# Patient Record
Sex: Male | Born: 1985 | Hispanic: No | Marital: Single | Smoking: Never smoker
Health system: Southern US, Community
[De-identification: ages and names within clinical notes are randomized; demographics above are authoritative.]

## PROBLEM LIST (undated history)

## (undated) DIAGNOSIS — I10 Essential (primary) hypertension: Secondary | ICD-10-CM

---

## 2010-02-19 ENCOUNTER — Ambulatory Visit: Payer: Self-pay | Admitting: Cardiovascular Disease

## 2010-02-19 ENCOUNTER — Observation Stay: Payer: Self-pay | Admitting: Internal Medicine

## 2016-04-24 ENCOUNTER — Encounter (HOSPITAL_COMMUNITY): Payer: Self-pay | Admitting: *Deleted

## 2016-04-24 ENCOUNTER — Emergency Department (HOSPITAL_COMMUNITY)
Admission: EM | Admit: 2016-04-24 | Discharge: 2016-04-24 | Disposition: A | Discharge status: Home / Self Care | Payer: BC Managed Care – PPO | Attending: Emergency Medicine | Admitting: Emergency Medicine

## 2016-04-24 ENCOUNTER — Emergency Department (HOSPITAL_COMMUNITY): Payer: BC Managed Care – PPO

## 2016-04-24 DIAGNOSIS — R002 Palpitations: Secondary | ICD-10-CM | POA: Insufficient documentation

## 2016-04-24 DIAGNOSIS — I10 Essential (primary) hypertension: Secondary | ICD-10-CM | POA: Insufficient documentation

## 2016-04-24 DIAGNOSIS — Z79899 Other long term (current) drug therapy: Secondary | ICD-10-CM | POA: Diagnosis not present

## 2016-04-24 HISTORY — DX: Essential (primary) hypertension: I10

## 2016-04-24 LAB — CBC
HCT: 48.1 % (ref 39.0–52.0)
Hemoglobin: 17.2 g/dL — ABNORMAL HIGH (ref 13.0–17.0)
MCH: 30.2 pg (ref 26.0–34.0)
MCHC: 35.8 g/dL (ref 30.0–36.0)
MCV: 84.4 fL (ref 78.0–100.0)
PLATELETS: 280 10*3/uL (ref 150–400)
RBC: 5.7 MIL/uL (ref 4.22–5.81)
RDW: 12.5 % (ref 11.5–15.5)
WBC: 8.1 10*3/uL (ref 4.0–10.5)

## 2016-04-24 LAB — BASIC METABOLIC PANEL
Anion gap: 15 (ref 5–15)
BUN: 13 mg/dL (ref 6–20)
CHLORIDE: 102 mmol/L (ref 101–111)
CO2: 20 mmol/L — AB (ref 22–32)
CREATININE: 1.07 mg/dL (ref 0.61–1.24)
Calcium: 9.6 mg/dL (ref 8.9–10.3)
GFR calc Af Amer: >60 mL/min (ref >60)
GFR calc non Af Amer: >60 mL/min (ref >60)
Glucose, Bld: 101 mg/dL — ABNORMAL HIGH (ref 65–99)
Potassium: 4 mmol/L (ref 3.5–5.1)
Sodium: 137 mmol/L (ref 135–145)

## 2016-04-24 LAB — I-STAT TROPONIN, ED: Troponin i, poc: 0 ng/mL (ref 0.00–0.08)

## 2016-04-24 NOTE — Discharge Planning (Signed)
[facility].  No needs identified or communicated.  Per EDP Laveda Normanran, " HEART score of 1, low risk of MACE.  Electrolytes are reassuring.  CXR unremarkable.  At this time, I encourage pt to f/u with cardiology for outpt evaluation.  Otherwise he is stable for discharge.  Pt already has an appointment to f/u with cardiology in less than 1 week."

## 2016-04-24 NOTE — ED Triage Notes (Addendum)
Patient comes in per [facility] with c/o palpitations intermittently that started 10 days ago. EKG shows HR increasing to 120-130 during palpitations. No cp. Right shoulder pain not consistent with heart rate change. Patient has lost 14 lbs in the last 2 wks. BP 150/110 during palpitations, 110/70 at NSR. RR 16, 98% RA. Hx htn. Recent diagnosis and started on HTZ. Was seen back in 2011 for same issue. Patient states he had a bloody stool on the 10th. Some c.o dizziness. Followed up with GI yesterday and plan to do colonoscopy.

## 2016-04-24 NOTE — ED Provider Notes (Signed)
[facility]-EMERGENCY DEPT Provider Note   CSN: [id number] Arrival date & time: 04/24/16  4098     History   Chief Complaint No chief complaint on file.   HPI Jimmy Herrera is a 31 y.o. male.  HPI   31 year old male brought here via EMS for evaluation of heart palpitation. Patient report 2 weeks ago he had one isolated episode of bright red blood per rectum which he went to the urgent care 10 days ago for evaluation. During his visit, it was noted that his blood pressure was elevated, patient was prescribed a low-dose of hydrochlorothiazide and was giving a GI referral as well as recommendation to change his diet. Since his urgent Care visit, patient has tried his best to change his diet, avoiding sodas, and food high in sodium. He notice a 14 pound weight loss within that timeframe. He also report having intermittent episodes of heart palpitation usually lasting for a few seconds but this morning he noticed an episode of pain to his right shoulder with tingling sensation down his right arm which concerns him. He went back to urgent care Center for this, and was sent to the ER for further evaluation. Patient admits that he has a family history of cardiac disease, when his dad has a quadruple bypass at age of 58s. Patient is not a smoker. He denies any active symptoms at this time. He denies any associated chest pain, shortness of breath, productive cough, hemoptysis, abdominal pain, back pain, active rectal bleeding. Denies any prior history of PE or DVT, no recent surgery, prolonged bed rest, unilateral leg swelling or calf pain. Also denies any recent consumption of stimulants of any sort. He is scheduled to be seen by a cardiologist next Tuesday. He also was seen by GI specialist yesterday. Has blood work done, and we'll schedule for a colonoscopy on February 13 for further evaluation of his GI bleed.  Past Medical History:  Diagnosis Date  . Hypertension     There are no active problems to display  for this patient.   History reviewed. No pertinent surgical history.     Home Medications    Prior to Admission medications   Not on File    Family History History reviewed. No pertinent family history.  Social History Social History  Substance Use Topics  . Smoking status: Never Smoker  . Smokeless tobacco: Never Used  . Alcohol use Not on file     Allergies   Patient has no allergy information on record.   Review of Systems Review of Systems  All other systems reviewed and are negative.    Physical Exam Updated Vital Signs Ht 5\' 6"  (1.676 m)   Wt 92.5 kg   BMI 32.93 kg/m   Physical Exam  Constitutional: He appears well-developed and well-nourished. No distress.  HENT:  Head: Atraumatic.  Eyes: Conjunctivae are normal.  Neck: Neck supple. No JVD present.  Cardiovascular: Normal rate, regular rhythm and intact distal pulses.   Pulmonary/Chest: Effort normal and breath sounds normal.  Abdominal: Soft. Bowel sounds are normal. He exhibits no distension. There is no tenderness.  Musculoskeletal: He exhibits no edema.  Neurological: He is alert.  Skin: No rash noted.  Psychiatric: He has a normal mood and affect.  Nursing note and vitals reviewed.    ED Treatments / Results  Labs (all labs ordered are listed, but only abnormal results are displayed) Labs Reviewed  BASIC METABOLIC PANEL - Abnormal; Notable for the following:  Result Value   CO2 20 (*)    Glucose, Bld 101 (*)    All other components within normal limits  CBC - Abnormal; Notable for the following:    Hemoglobin 17.2 (*)    All other components within normal limits  I-STAT TROPOININ, ED    EKG  EKG Interpretation  Date/Time:  Wednesday April 24 2016 10:05:38 EST Ventricular Rate:  99 PR Interval:    QRS Duration: 99 QT Interval:  335 QTC Calculation: 430 R Axis:   73 Text Interpretation:  Sinus tachycardia Multiple premature complexes, vent & supraven T wave  abnormality Abnormal ekg Confirmed by Gerhard MunchLOCKWOOD, ROBERT  MD ([id number]) on 04/24/2016 10:29:57 AM       Radiology Dg Chest Port 1 View  Result Date: 04/24/2016 CLINICAL DATA:  Rapid heart rate. EXAM: PORTABLE CHEST 1 VIEW COMPARISON:  02/19/2010 . FINDINGS: Mediastinum and hilar structures normal. Mild cardiomegaly, no pulmonary venous could. No focal infiltrate. No pleural effusion or pneumothorax . No acute bony abnormality. IMPRESSION: Mild cardiomegaly. No pulmonary venous congestion. No acute pulmonary disease . Electronically Signed   By: Maisie Fushomas  Register   On: 04/24/2016 10:43    Procedures Procedures (including critical care time)  Medications Ordered in ED Medications - No data to display   Initial Impression / Assessment and Plan / ED Course  I have reviewed the triage vital signs and the nursing notes.  Pertinent labs & imaging results that were available during my care of the patient were reviewed by me and considered in my medical decision making (see chart for details).     BP 155/97   Pulse 95   Resp 18   Ht 5\' 6"  (1.676 m)   Wt 92.5 kg   SpO2 96%   BMI 32.93 kg/m    Final Clinical Impressions(s) / ED Diagnoses   Final diagnoses:  Palpitation    New Prescriptions New Prescriptions   No medications on file   10:31 AM Patient here for evaluation of intermittent heart palpitation for the past 10 days. He reports a brief episode of bright red blood per rectum about 2 weeks ago but none since. He also reports changing his diet because he was found to have high blood pressure during his last visit to the urgent care center 10 days ago. He mentioned 14 pound weight loss from change in diet. His primary concern is a strong family history of cardiac disease, as well as having intermittent right arm right shoulder discomfort earlier today. His examination is unremarkable. He has intact distal pulses throughout all 4 extremities. He does not have any significant risk factor  for PE. Symptoms atypical for ACS. EKG demonstrated a single PVC which corresponds with patient's heart palpitation sensation. Workup initiated. Care discussed with Dr. Jeraldine LootsLockwood.  11:35 AM HEART score of 1, low risk of MACE.  Electrolytes are reassuring.  CXR unremarkable.  At this time, I encourage pt to f/u with cardiology for outpt evaluation.  Otherwise he is stable for discharge.  Pt already has an appointment to f/u with cardiology in less than 1 week.    Fayrene HelperBowie Imri Lor, PA-C 04/24/16 1147    Gerhard Munchobert Lockwood, MD 04/24/16 [phone]

## 2018-03-28 IMAGING — CR DG CHEST 1V PORT
1 series · 1 of 1 positions shown · non-contrast
Comparison: 02/19/2010 .

CLINICAL DATA: Rapid heart rate.

EXAM:
PORTABLE CHEST 1 VIEW

[AP]
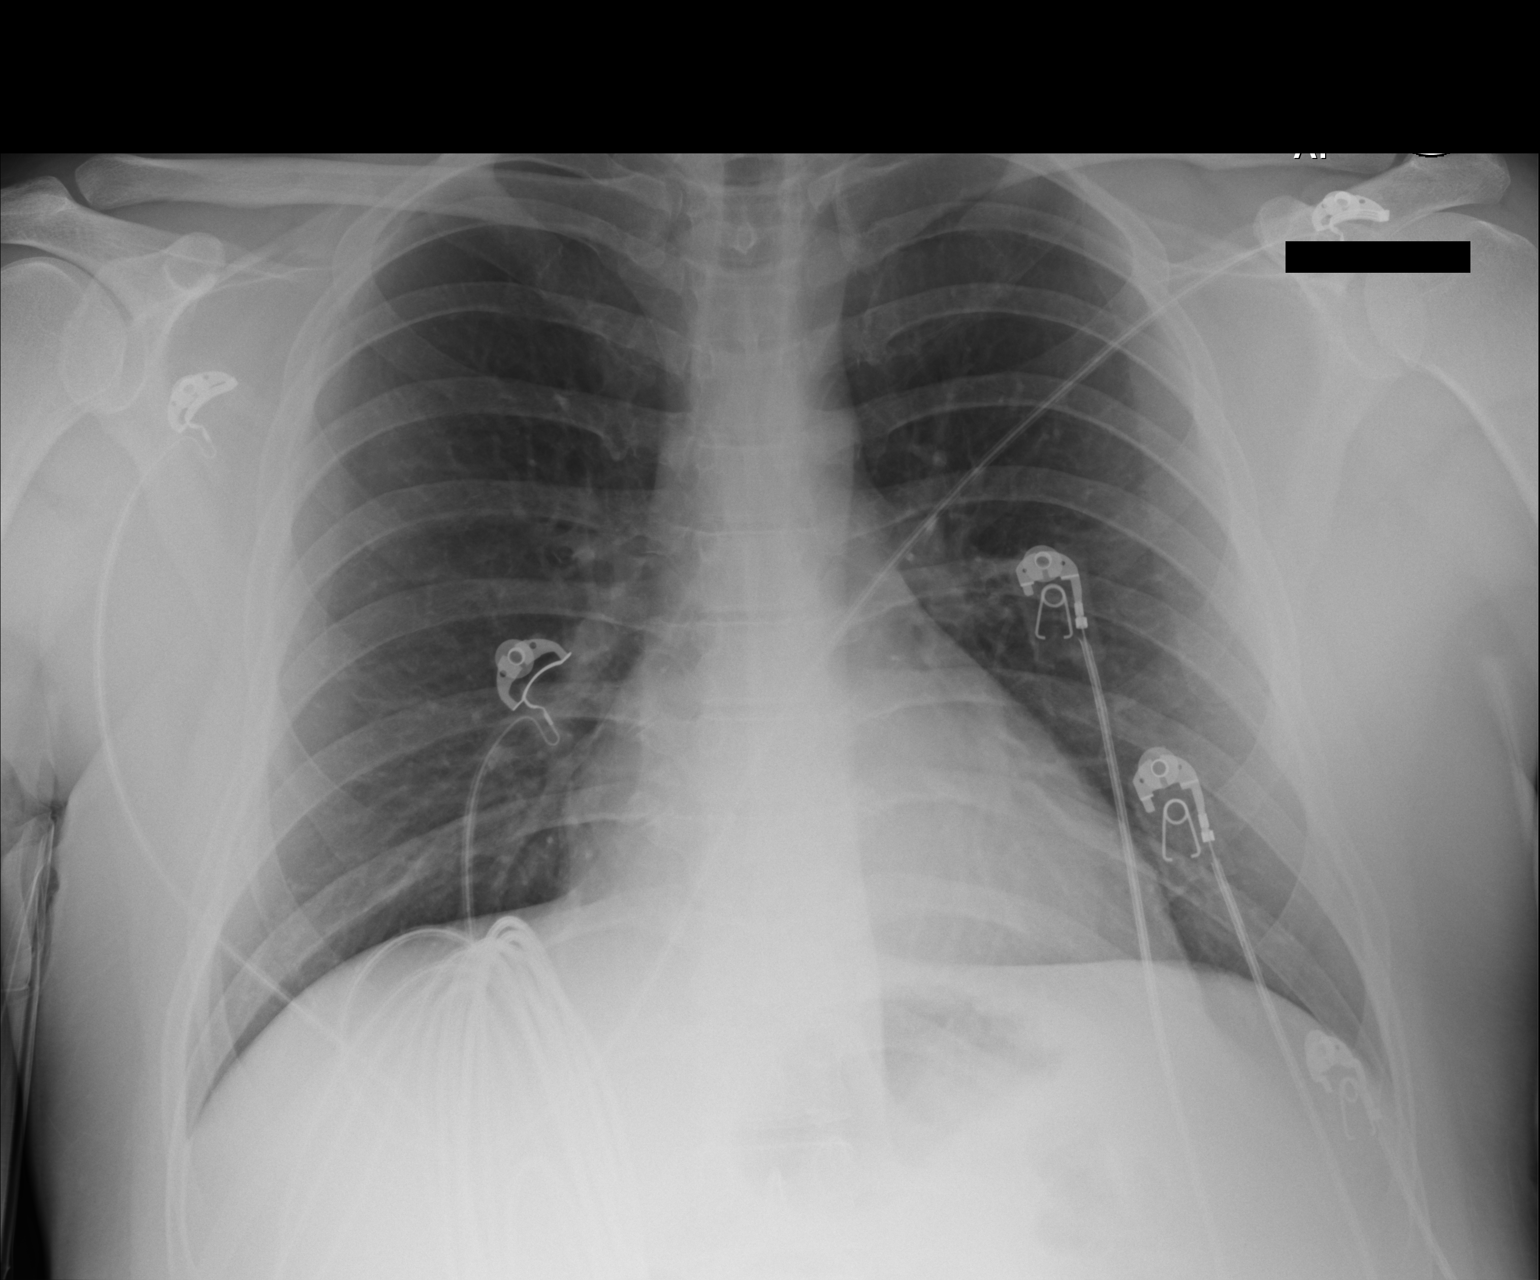

[1 of 1 positions shown; findings below may reference images not displayed]

FINDINGS: Mediastinum and hilar structures normal. Mild cardiomegaly, no
pulmonary venous could. No focal infiltrate. No pleural effusion or
pneumothorax . No acute bony abnormality.
IMPRESSION: Mild cardiomegaly. No pulmonary venous congestion. No acute
pulmonary disease .
